# Patient Record
Sex: Male | Born: 1960 | Race: Black or African American | Hispanic: No | Marital: Married
Health system: Southern US, Community
[De-identification: ages and names within clinical notes are randomized; demographics above are authoritative.]

---

## 2011-03-01 ENCOUNTER — Emergency Department (HOSPITAL_COMMUNITY)
Admission: EM | Admit: 2011-03-01 | Discharge: 2011-03-01 | Disposition: A | Discharge status: Home / Self Care | Payer: Self-pay | Attending: Emergency Medicine | Admitting: Emergency Medicine

## 2011-03-01 DIAGNOSIS — K029 Dental caries, unspecified: Secondary | ICD-10-CM | POA: Insufficient documentation

## 2011-03-01 DIAGNOSIS — K089 Disorder of teeth and supporting structures, unspecified: Secondary | ICD-10-CM | POA: Insufficient documentation

## 2011-03-01 DIAGNOSIS — R6884 Jaw pain: Secondary | ICD-10-CM | POA: Insufficient documentation

## 2011-03-01 DIAGNOSIS — K0381 Cracked tooth: Secondary | ICD-10-CM | POA: Insufficient documentation

## 2014-03-31 ENCOUNTER — Emergency Department: Payer: Self-pay | Admitting: Emergency Medicine

## 2014-09-14 ENCOUNTER — Ambulatory Visit: Payer: Self-pay | Admitting: Obstetrics and Gynecology

## 2016-05-01 ENCOUNTER — Encounter (INDEPENDENT_AMBULATORY_CARE_PROVIDER_SITE_OTHER): Payer: Self-pay

## 2016-05-01 ENCOUNTER — Ambulatory Visit: Payer: Self-pay | Admitting: Pharmacy Technician

## 2016-05-01 DIAGNOSIS — Z79899 Other long term (current) drug therapy: Secondary | ICD-10-CM

## 2016-05-02 NOTE — Progress Notes (Signed)
  Completed [facility] application and contract.  Patient agreed to all terms of the [facility] contract.   Provided patient with community resource material based on his particular needs.     Craig Pope J. Srikar Chiang Care Manager [facility]  

## 2016-07-14 IMAGING — CR DG KNEE COMPLETE 4+V*R*
1 series · 4 of 4 positions shown · non-contrast
Comparison: None available.

CLINICAL DATA: Initial evaluation for arthritis.

EXAM:
RIGHT KNEE - COMPLETE 4+ VIEW

[Series 1: t knee ap right · 0.14mm/px · 4 of 4 slices shown]
[im 1/4]
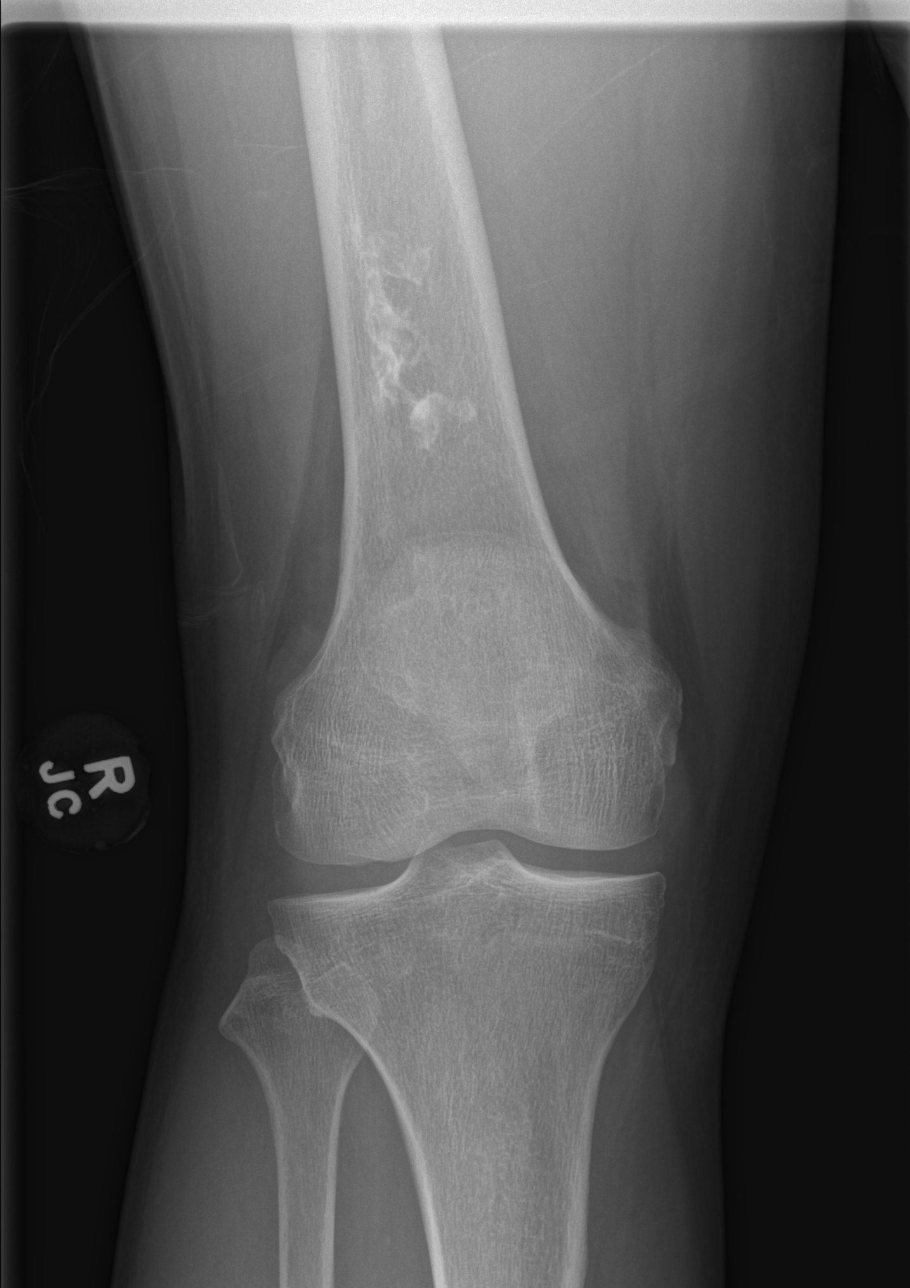
[im 2/4]
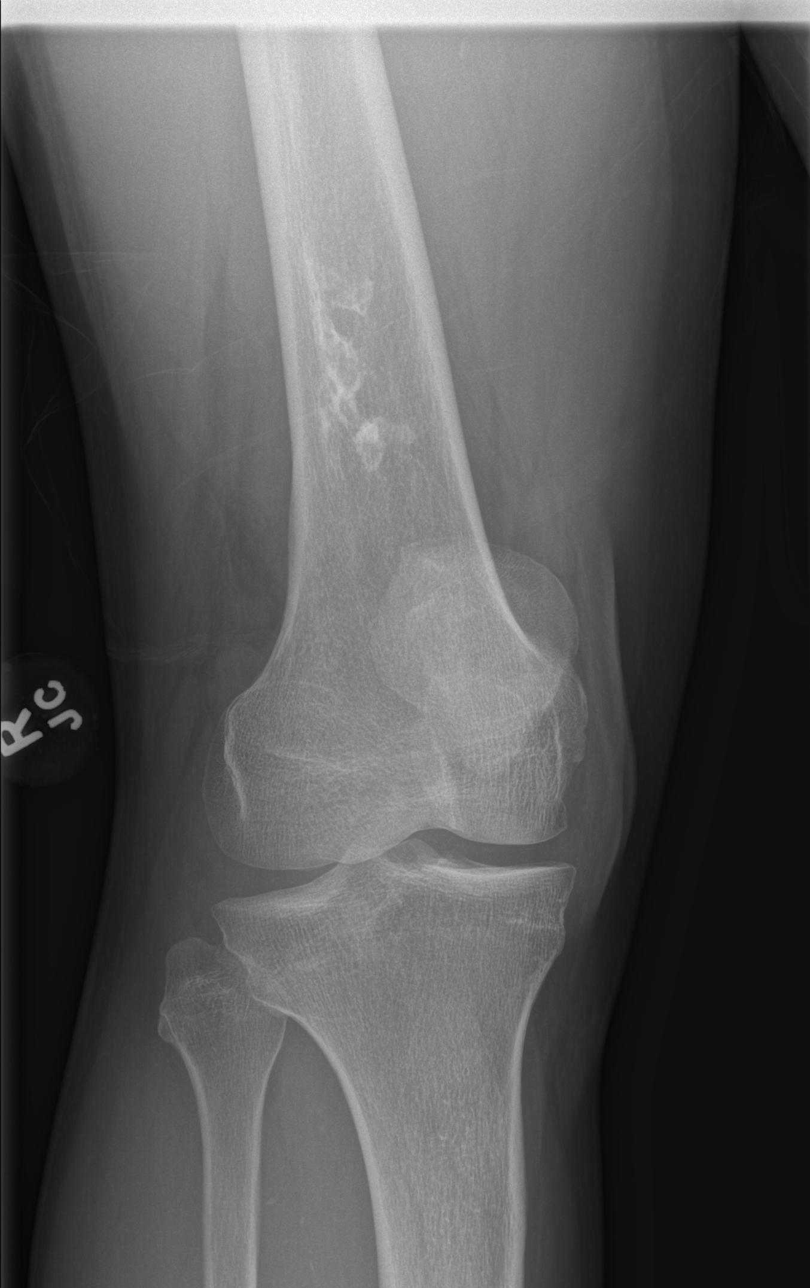
[im 3/4]
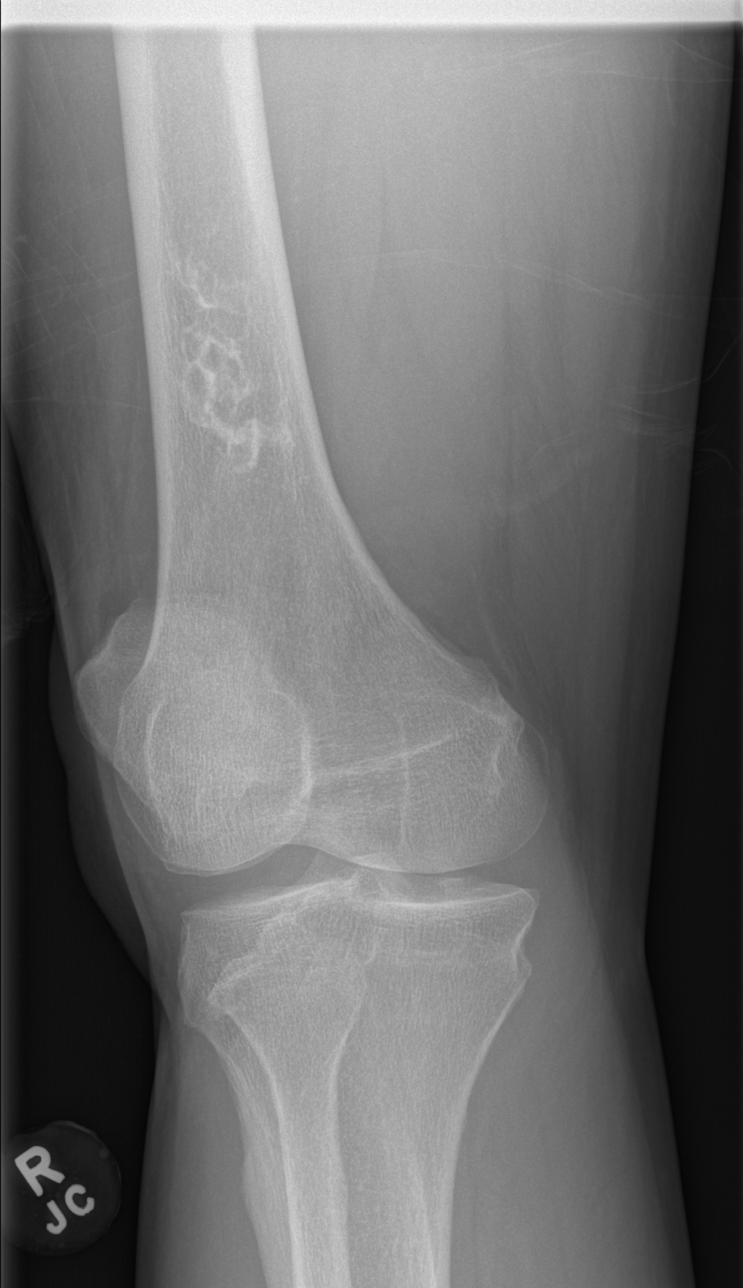
[im 4/4]
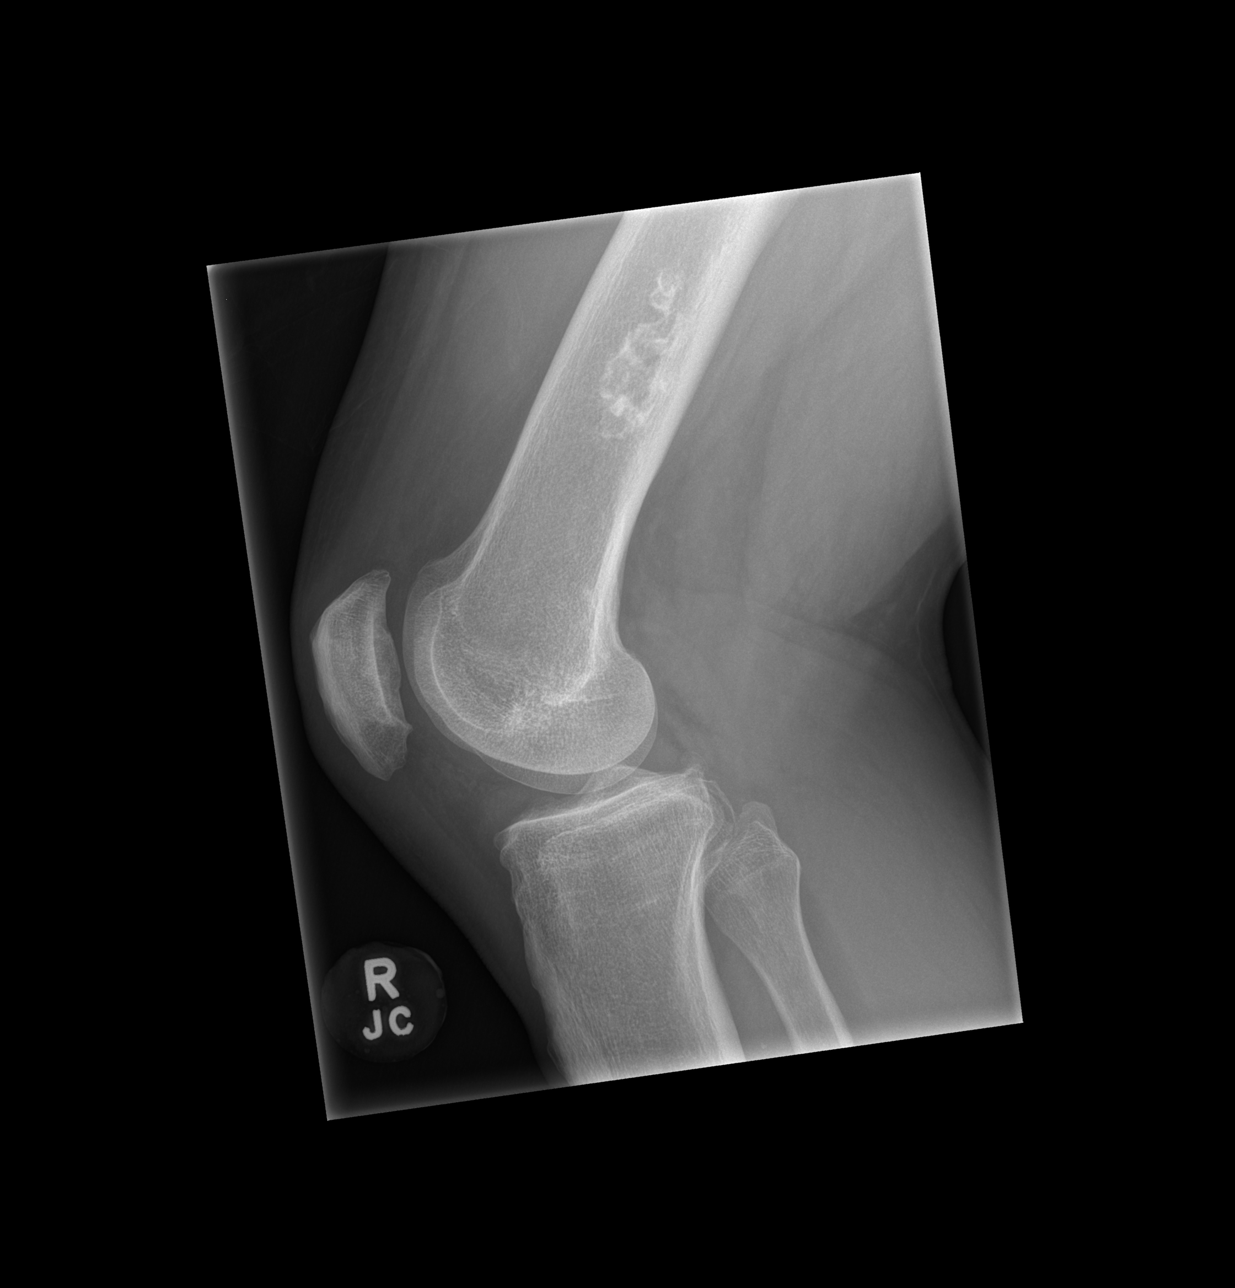

[4 of 4 positions shown; findings below may reference images not displayed]

FINDINGS: No acute fracture or dislocation. No joint effusion. Mild
patellofemoral degenerative osteoarthrosis present. Joint spaces
otherwise well maintained. Serpiginous sclerosis within the distal
femoral shaft most compatible with remote bone infarct. No other
focal osseous lesion. Osseous mineralization normal. No soft tissue
abnormality.
IMPRESSION: 1. No acute fracture or dislocation.
2. Mild patellofemoral degenerative osteoarthrosis.
3. Serpiginous sclerosis within the distal femoral shaft, most
consistent with a remote bone infarct.

## 2016-10-03 ENCOUNTER — Telehealth: Payer: Self-pay | Admitting: Pharmacy Technician

## 2016-10-03 NOTE — Telephone Encounter (Signed)
Received 2018 financial information.  Patient approved to receive medication assistance through 2018, as long as total household income does not exceed 250% FPL or pt does not obtain prescription coverage.  Craig Pope Care Manager [facility]

## 2017-07-21 ENCOUNTER — Telehealth: Payer: Self-pay | Admitting: Pharmacy Technician

## 2017-07-21 NOTE — Telephone Encounter (Signed)
Pt has prescription drug coverage.  No longer eligible to receive medication assistance from [facility].  Sherilyn DacostaBetty J. Rumi Kolodziej Care Manager [facility]

## 2023-10-31 ENCOUNTER — Ambulatory Visit: Admitting: Podiatry

## 2023-10-31 ENCOUNTER — Encounter: Payer: Self-pay | Admitting: Podiatry

## 2023-10-31 DIAGNOSIS — M79675 Pain in left toe(s): Secondary | ICD-10-CM | POA: Diagnosis not present

## 2023-10-31 DIAGNOSIS — M2011 Hallux valgus (acquired), right foot: Secondary | ICD-10-CM | POA: Diagnosis not present

## 2023-10-31 DIAGNOSIS — M2012 Hallux valgus (acquired), left foot: Secondary | ICD-10-CM | POA: Diagnosis not present

## 2023-10-31 DIAGNOSIS — M2041 Other hammer toe(s) (acquired), right foot: Secondary | ICD-10-CM | POA: Diagnosis not present

## 2023-10-31 DIAGNOSIS — B353 Tinea pedis: Secondary | ICD-10-CM

## 2023-10-31 DIAGNOSIS — M79674 Pain in right toe(s): Secondary | ICD-10-CM

## 2023-10-31 DIAGNOSIS — M2042 Other hammer toe(s) (acquired), left foot: Secondary | ICD-10-CM | POA: Diagnosis not present

## 2023-10-31 DIAGNOSIS — B351 Tinea unguium: Secondary | ICD-10-CM | POA: Diagnosis not present

## 2023-10-31 MED ORDER — KETOCONAZOLE 2 % EX CREA: TOPICAL_CREAM | CUTANEOUS | 1 refills | Status: AC

## 2023-10-31 NOTE — Patient Instructions (Signed)
 To prevent reinfection, spray shoes with lysol every evening.  Clean tub or shower with bleach based cleanser.  Athlete's Foot Athlete's foot (tinea pedis) is a fungal infection of the skin on your feet. It often occurs on the skin that is between or underneath the toes. It can also occur on the soles of your feet. The infection can spread from person to person (is contagious). It can also spread when a person's bare feet come in contact with the fungus on shower floors or on items such as shoes. What are the causes? This condition is caused by a fungus that grows in warm, moist places. You can get athlete's foot by sharing shoes, shower stalls, towels, and wet floors with someone who is infected. Not washing your feet or changing your socks often enough can also lead to athlete's foot. What increases the risk? This condition is more likely to develop in: Men. People who have a weak body defense system (immune system). People who have diabetes. People who use public showers, such as at a gym. People who wear heavy-duty shoes, such as industrial or military shoes. Seasons with warm, humid weather. What are the signs or symptoms? Symptoms of this condition include: Itchy areas between your toes or on the soles of your feet. White, flaky, or scaly areas between your toes or on the soles of your feet. Very itchy small blisters between your toes or on the soles of your feet. Small cuts in your skin. These cuts can become infected. Thick or discolored toenails. How is this diagnosed? This condition may be diagnosed with a physical exam and a review of your medical history. Your health care provider may also take a skin or toenail sample to examine under a microscope. How is this treated? This condition is treated with antifungal medicines. These may be applied as powders, ointments, or creams. In severe cases, an oral antifungal medicine may be given. Follow these instructions at  home: Medicines Apply or take over-the-counter and prescription medicines only as told by your health care provider. Apply your antifungal medicine as told by your health care provider. Do not stop using the antifungal even if your condition improves. Foot care Do not scratch your feet. Keep your feet dry: Wear cotton or wool socks. Change your socks every day or if they become wet. Wear shoes that allow air to flow, such as sandals or canvas tennis shoes. Wash and dry your feet, including the area between your toes. Also, wash and dry your feet: Every day or as told by your health care provider. After exercising. General instructions Do not let others use towels, shoes, nail clippers, or other personal items that touch your feet. Protect your feet by wearing sandals in wet areas, such as locker rooms and shared showers. Keep all follow-up visits. This is important. If you have diabetes, keep your blood sugar under control. Contact a health care provider if: You have a fever. You have swelling, soreness, warmth, or redness in your foot. Your feet are not getting better with treatment. Your symptoms get worse. You have new symptoms. You have severe pain. Summary Athlete's foot (tinea pedis) is a fungal infection of the skin on your feet. It often occurs on skin that is between or underneath the toes. This condition is caused by a fungus that grows in warm, moist places. Symptoms include white, flaky, or scaly areas between your toes or on the soles of your feet. This condition is treated with antifungal medicines.   Keep your feet clean. Always dry them thoroughly. This information is not intended to replace advice given to you by your health care provider. Make sure you discuss any questions you have with your health care provider. Document Revised: 10/08/2020 Document Reviewed: 10/08/2020 Elsevier Patient Education  2024 Elsevier Inc.  

## 2023-10-31 NOTE — Progress Notes (Signed)
 Subjective: Craig Pope presents today referred by [facility] for complaint of with chief concern of elongated, thickened, painful, discolored toenails for year. Patient has tried none.Aaron Aas   History reviewed. No pertinent past medical history.   There are no active problems to display for this patient.    History reviewed. No pertinent surgical history.   No current outpatient medications on file prior to visit.   No current facility-administered medications on file prior to visit.     No Known Allergies   Social History   Occupational History   Not on file  Tobacco Use   Smoking status: Every Day    Types: Cigarettes   Smokeless tobacco: Never  Substance and Sexual Activity   Alcohol use: Not Currently   Drug use: Never   Sexual activity: Not on file     History reviewed. No pertinent family history.    There is no immunization history on file for this patient.   Objective: There were no vitals filed for this visit.  Craig Pope is a pleasant 63 y.o. male obese in NAD. AAO x 3.  Vascular Examination: Vascular status intact b/l with palpable pedal pulses. Pedal hair present b/l. CFT immediate b/l. No edema. No pain with calf compression b/l. Skin temperature gradient WNL b/l. No ischemia or gangrene noted b/l LE. No cyanosis or clubbing noted b/l LE.  Neurological Examination: Sensation grossly intact b/l with 10 gram monofilament. Vibratory sensation intact b/l.   Dermatological Examination: Pedal skin with normal turgor, texture and tone b/l. Toenails 1-5 b/l thick, discolored, elongated with subungual debris and pain on dorsal palpation. No hyperkeratotic lesions noted b/l. Diffuse scaling noted peripherally and plantarly b/l feet.  Mild foot odor. No interdigital macerations.  No blisters, no weeping. No signs of secondary bacterial infection noted.  Musculoskeletal Examination: Muscle strength 5/5 to b/l LE. HAV with bunion  bilaterally and hammertoes 2-5 b/l. Patient ambulates independent of any assistive aids.  Radiographs: None  Assessment: 1. Pain due to onychomycosis of toenails of both feet   2. Tinea pedis of both feet   3. Hallux valgus, acquired, bilateral   4. Acquired hammertoes of both feet     Plan: Meds ordered this encounter  Medications   ketoconazole (NIZORAL) 2 % cream    Sig: Apply to both feet and between toes once daily for 6 weeks.    Dispense:  60 g    Refill:  1   -Patient was evaluated today. All questions/concerns addressed on today's visit. -Consent given for treatment as described below: -Medicare ABN signed. Patient consents for services of toenail debridement  today. Copy has been placed in patient's chart. -Patient to continue soft, supportive shoe gear daily. -Mycotic toenails 1-5 bilaterally were debrided in length and girth with sterile nail nippers and dremel without incident. -Discussed tinea pedis infection. To prevent re-infection of tinea pedis, patient/POA/caregiver instructed to spray shoes with Lysol every evening and clean tub/shower with bleach based cleanser. Rx sent to pharmacy for Ketoconazole Cream 2% to be applied to both feet and between toes once daily for six week.  -Patient/POA to call should there be question/concern in the interim.  Return in about 4 months (around 03/02/2024).  Craig Pope, DPM      [place] LOCATION: [address]  [place], [place] [address]                   Office [phone]   [facility] LOCATION: [address] [place], [place] [address] Office [phone]

## 2023-11-05 ENCOUNTER — Telehealth: Payer: Self-pay | Admitting: Podiatry

## 2023-11-14 ENCOUNTER — Telehealth (INDEPENDENT_AMBULATORY_CARE_PROVIDER_SITE_OTHER): Payer: Self-pay | Admitting: Podiatry

## 2023-11-14 ENCOUNTER — Other Ambulatory Visit (INDEPENDENT_AMBULATORY_CARE_PROVIDER_SITE_OTHER): Payer: Self-pay | Admitting: Podiatry

## 2023-11-14 NOTE — Telephone Encounter (Signed)
 Patient walked into [place] office regarding not receiving his prescription for Ketoconazole  Cream 2% on May 2nd. I called pharmacy at 12:08 pm today and spoke to pharmacy staff Dynasty. Dynasty stated Rx was ready on May 5th and a notification was sent to the patient. Dynasty stated the patient would need to request the prescription from them again for them to process it as it is on file. Patient given number to [facility] in [place] as well as [employer] logistics/support/administrative officer name.

## 2023-11-14 NOTE — Progress Notes (Signed)
 Patient walked into [place] office regarding not receiving his prescription for Ketoconazole  Cream 2% on May 2nd. I called pharmacy and spoke to Dynasty. Dynasty stated Rx was ready on May 5th and a notification was sent to the patient. Dynasty stated the patient would need to request the prescription from them again for them to process it as it is on file. Patient given number to [facility] in [place] as well as [employer] forces logistics/support/administrative officer name.

## 2024-01-16 ENCOUNTER — Ambulatory Visit: Admitting: Podiatry

## 2024-01-16 ENCOUNTER — Encounter: Payer: Self-pay | Admitting: Podiatry

## 2024-01-16 DIAGNOSIS — M79674 Pain in right toe(s): Secondary | ICD-10-CM

## 2024-01-16 DIAGNOSIS — B351 Tinea unguium: Secondary | ICD-10-CM

## 2024-01-16 DIAGNOSIS — M79675 Pain in left toe(s): Secondary | ICD-10-CM | POA: Diagnosis not present

## 2024-01-22 NOTE — Telephone Encounter (Signed)
 Error

## 2024-01-23 ENCOUNTER — Encounter: Payer: Self-pay | Admitting: Podiatry

## 2024-01-23 NOTE — Progress Notes (Signed)
  Subjective:  Patient ID: Craig Pope, male    DOB: 10-06-1960,  MRN: [id number]  63 y.o. male presents to clinic with  painful thick toenails that are difficult to trim. Pain interferes with ambulation. Aggravating factors include wearing enclosed shoe gear. Pain is relieved with periodic professional debridement.  Chief Complaint  Patient presents with   DFc    Rm1 not diabetic     New problem(s): None   PCP is [facility].  No Known Allergies  Review of Systems: Negative except as noted in the HPI.   Objective:  Craig Pope is a pleasant 63 y.o. male in NAD. AAO x 3.  Vascular Examination: Vascular status intact b/l with palpable pedal pulses. CFT immediate b/l. No edema. No pain with calf compression b/l. Skin temperature gradient WNL b/l. Pedal hair present.  Neurological Examination: Sensation grossly intact b/l with 10 gram monofilament. Vibratory sensation intact b/l.   Dermatological Examination: Pedal skin with normal turgor, texture and tone b/l. Toenails 1-5 b/l thick, discolored, elongated with subungual debris and pain on dorsal palpation. No hyperkeratotic lesions noted b/l.   Musculoskeletal Examination: Muscle strength 5/5 to b/l LE. HAV with bunion deformity noted b/l LE. Hammertoe(s) 2-5 b/l.  Radiographs: None  Last A1c:       No data to display           Assessment:   1. Pain due to onychomycosis of toenails of both feet    Plan:  Patient was evaluated and treated. All patient's and/or POA's questions/concerns addressed on today's visit. Toenails 1-5 debrided in length and girth without incident.Treatment was provided by assistant Mable FABIENE Cherry under my supervision. Continue soft, supportive shoe gear daily. Report any pedal injuries to medical professional. Call office if there are any questions/concerns. Patient/POA to call should there be question/concern in the interim.  Return in about 3 months (around  04/17/2024).  Delon LITTIE Merlin, DPM      [place] LOCATION: [address], [place] [address]                   Office [phone]   [facility] LOCATION: [address] [place], [place] [address] Office [phone]

## 2024-02-12 ENCOUNTER — Ambulatory Visit: Admitting: Podiatry

## 2024-04-16 ENCOUNTER — Ambulatory Visit: Admitting: Podiatry

## 2024-04-16 DIAGNOSIS — Z91199 Patient's noncompliance with other medical treatment and regimen due to unspecified reason: Secondary | ICD-10-CM

## 2024-04-16 NOTE — Progress Notes (Signed)
 1. No-show for appointment
